# Patient Record
Sex: Female | Born: 1975 | Race: White | Hispanic: No | Marital: Married | Smoking: Never smoker
Health system: Southern US, Community
[De-identification: ages and names within clinical notes are randomized; demographics above are authoritative.]

---

## 1998-07-04 ENCOUNTER — Other Ambulatory Visit: Admission: RE | Admit: 1998-07-04 | Discharge: 1998-07-04 | Payer: Self-pay | Admitting: Gynecology

## 1999-09-18 ENCOUNTER — Other Ambulatory Visit: Admission: RE | Admit: 1999-09-18 | Discharge: 1999-09-18 | Payer: Self-pay | Admitting: Gynecology

## 2000-09-29 ENCOUNTER — Other Ambulatory Visit: Admission: RE | Admit: 2000-09-29 | Discharge: 2000-09-29 | Payer: Self-pay | Admitting: Gynecology

## 2001-11-03 ENCOUNTER — Other Ambulatory Visit: Admission: RE | Admit: 2001-11-03 | Discharge: 2001-11-03 | Payer: Self-pay | Admitting: Gynecology

## 2010-05-07 ENCOUNTER — Ambulatory Visit: Payer: Self-pay | Admitting: Obstetrics and Gynecology

## 2010-05-21 ENCOUNTER — Ambulatory Visit: Payer: Self-pay | Admitting: Obstetrics and Gynecology

## 2010-06-04 ENCOUNTER — Ambulatory Visit: Payer: Self-pay | Admitting: Advanced Practice Midwife

## 2010-06-20 ENCOUNTER — Ambulatory Visit: Payer: Self-pay | Admitting: Obstetrics & Gynecology

## 2010-06-27 ENCOUNTER — Ambulatory Visit
Admission: RE | Admit: 2010-06-27 | Discharge: 2010-06-27 | Payer: Self-pay | Source: Home / Self Care | Attending: Obstetrics & Gynecology | Admitting: Obstetrics & Gynecology

## 2010-07-04 ENCOUNTER — Ambulatory Visit
Admission: RE | Admit: 2010-07-04 | Discharge: 2010-07-04 | Payer: Self-pay | Source: Home / Self Care | Attending: Obstetrics & Gynecology | Admitting: Obstetrics & Gynecology

## 2010-07-05 ENCOUNTER — Encounter: Payer: Self-pay | Admitting: Obstetrics & Gynecology

## 2010-07-05 LAB — CONVERTED CEMR LAB: GC Probe Amp, Genital: NEGATIVE

## 2010-07-10 ENCOUNTER — Ambulatory Visit
Admission: RE | Admit: 2010-07-10 | Discharge: 2010-07-10 | Payer: Self-pay | Source: Home / Self Care | Attending: Obstetrics & Gynecology | Admitting: Obstetrics & Gynecology

## 2010-07-20 ENCOUNTER — Ambulatory Visit
Admission: RE | Admit: 2010-07-20 | Discharge: 2010-07-20 | Payer: Self-pay | Source: Home / Self Care | Attending: Family | Admitting: Family

## 2010-07-27 DIAGNOSIS — Z3480 Encounter for supervision of other normal pregnancy, unspecified trimester: Secondary | ICD-10-CM

## 2010-07-30 ENCOUNTER — Inpatient Hospital Stay (HOSPITAL_COMMUNITY): Admission: AD | Admit: 2010-07-30 | Payer: Self-pay | Source: Home / Self Care | Admitting: Obstetrics & Gynecology

## 2010-08-03 DIAGNOSIS — Z3480 Encounter for supervision of other normal pregnancy, unspecified trimester: Secondary | ICD-10-CM

## 2010-08-03 DIAGNOSIS — O480 Post-term pregnancy: Secondary | ICD-10-CM

## 2010-08-08 ENCOUNTER — Other Ambulatory Visit: Payer: 59 | Admitting: Obstetrics & Gynecology

## 2010-08-08 DIAGNOSIS — O480 Post-term pregnancy: Secondary | ICD-10-CM

## 2010-08-10 ENCOUNTER — Inpatient Hospital Stay (HOSPITAL_COMMUNITY)
Admission: AD | Admit: 2010-08-10 | Discharge: 2010-08-13 | DRG: 775 | Disposition: A | Discharge status: Home / Self Care | Payer: 59 | Source: Ambulatory Visit | Attending: Obstetrics & Gynecology | Admitting: Obstetrics & Gynecology

## 2010-08-10 DIAGNOSIS — O480 Post-term pregnancy: Principal | ICD-10-CM | POA: Diagnosis present

## 2010-08-10 LAB — URIC ACID: Uric Acid, Serum: 2.8 mg/dL (ref 2.4–7.0)

## 2010-08-10 LAB — COMPREHENSIVE METABOLIC PANEL
ALT: 11 U/L (ref 0–35)
AST: 24 U/L (ref 0–37)
Albumin: 3 g/dL — ABNORMAL LOW (ref 3.5–5.2)
Alkaline Phosphatase: 125 U/L — ABNORMAL HIGH (ref 39–117)
BUN: 5 mg/dL — ABNORMAL LOW (ref 6–23)
CO2: 25 mEq/L (ref 19–32)
Calcium: 9.3 mg/dL (ref 8.4–10.5)
Chloride: 103 mEq/L (ref 96–112)
Creatinine, Ser: 0.54 mg/dL (ref 0.4–1.2)
GFR calc Af Amer: >60 mL/min (ref >60)
GFR calc non Af Amer: >60 mL/min (ref >60)
Glucose, Bld: 129 mg/dL — ABNORMAL HIGH (ref 70–99)
Potassium: 4 mEq/L (ref 3.5–5.1)
Sodium: 135 mEq/L (ref 135–145)
Total Bilirubin: 0.4 mg/dL (ref 0.3–1.2)
Total Protein: 6.2 g/dL (ref 6.0–8.3)

## 2010-08-10 LAB — CBC
HCT: 39.4 % (ref 36.0–46.0)
Hemoglobin: 13.4 g/dL (ref 12.0–15.0)
MCH: 31.2 pg (ref 26.0–34.0)
MCHC: 34 g/dL (ref 30.0–36.0)
MCV: 91.6 fL (ref 78.0–100.0)
Platelets: 262 10*3/uL (ref 150–400)
RBC: 4.3 MIL/uL (ref 3.87–5.11)
RDW: 14 % (ref 11.5–15.5)
WBC: 12.6 10*3/uL — ABNORMAL HIGH (ref 4.0–10.5)

## 2010-08-10 LAB — LACTATE DEHYDROGENASE: LDH: 144 U/L (ref 94–250)

## 2010-08-11 DIAGNOSIS — O480 Post-term pregnancy: Secondary | ICD-10-CM

## 2010-08-11 LAB — RPR: RPR Ser Ql: NONREACTIVE

## 2010-08-12 LAB — ABO/RH: ABO/RH(D): O POS

## 2010-09-24 ENCOUNTER — Ambulatory Visit: Payer: 59

## 2010-09-24 DIAGNOSIS — Z3480 Encounter for supervision of other normal pregnancy, unspecified trimester: Secondary | ICD-10-CM

## 2010-09-25 NOTE — Assessment & Plan Note (Signed)
NAME:  CAILEN, TEXEIRA NO.:  [id number]  MEDICAL RECORD NO.:  [id number]           PATIENT TYPE:  LOCATION:  [facility] [place]           FACILITY:  PHYSICIAN:  Kayliana Codd Christy Gentles, CNM       DATE OF BIRTH:  31-Dec-1975  DATE OF SERVICE:  09/24/2010                                 CLINIC NOTE  HISTORY:  This is a 35 year old G1 P1-0-0-1 who is now 6 weeks post NSVD female, discussed her hospital and labor experience.  She is pleased with all of that.  The baby weighed 8 at birth, now weighs about 10 and is thriving.  She is breast-feeding without supplementation.  She did not need suturing of the perineum.  She says she is sleeping well.  The baby sleeps 7 or 8 hours a night.  She has no plans to go back to work. She is requesting a refill on prenatal vitamins.  She states that she has had paps yearly and they have all been negative for more than the last 3 years.  Her last Pap was negative in May 2011.  She plans to use condoms and continue breast-feeding without supplementation.  Her husband is considering vasectomy.  She has plenty of help at home.  She denies significant depression symptoms.  She states she has minimal lochia, just a little dark pink brown spotting.  She denies any irritative vaginal discharge or specific concerns.  OBJECTIVE:  VITAL SIGNS:  BP 132/84, weight 163, height 5 feet 7 inches. HEAD:  Normocephalic.  Good dentition. NECK:  No thyromegaly. LUNGS:  CTA bilateral. HEART:  RRR without murmur. BREASTS:  Lactational, no inflammation. ABDOMEN:  Soft, flat, nontender, minimal diastasis rectus.  Uterus very well involuted and nontender. PELVIC:  Deferred. EXTREMITIES:  No edema.  The [place] postnatal depression scale is completed and is reassuring.  ASSESSMENT:  Normal 6 weeks postpartum G1 P1-0-0-1.  PLAN:  We discussed recommendations regarding Pap smears and she elects to follow the [employer] recommendations and declines  Pap at this visit.  We discussed the need for yearly well-woman exam and she is encouraged to continue good diet and drink plenty of fluids.  She is given a refill for prenatal vitamins and we will see her back in 1 year.          ______________________________ Caren Griffins, CNM    DP/MEDQ  D:  09/24/2010  T:  09/25/2010  Job:  [phone]

## 2011-07-19 ENCOUNTER — Emergency Department (INDEPENDENT_AMBULATORY_CARE_PROVIDER_SITE_OTHER): Payer: 59

## 2011-07-19 ENCOUNTER — Emergency Department (HOSPITAL_BASED_OUTPATIENT_CLINIC_OR_DEPARTMENT_OTHER)
Admission: EM | Admit: 2011-07-19 | Discharge: 2011-07-19 | Disposition: A | Discharge status: Home / Self Care | Payer: 59 | Attending: Emergency Medicine | Admitting: Emergency Medicine

## 2011-07-19 ENCOUNTER — Encounter (HOSPITAL_BASED_OUTPATIENT_CLINIC_OR_DEPARTMENT_OTHER): Payer: Self-pay

## 2011-07-19 DIAGNOSIS — S52123A Displaced fracture of head of unspecified radius, initial encounter for closed fracture: Secondary | ICD-10-CM

## 2011-07-19 DIAGNOSIS — W19XXXA Unspecified fall, initial encounter: Secondary | ICD-10-CM

## 2011-07-19 DIAGNOSIS — M25529 Pain in unspecified elbow: Secondary | ICD-10-CM | POA: Insufficient documentation

## 2011-07-19 DIAGNOSIS — M25429 Effusion, unspecified elbow: Secondary | ICD-10-CM

## 2011-07-19 DIAGNOSIS — S52122A Displaced fracture of head of left radius, initial encounter for closed fracture: Secondary | ICD-10-CM

## 2011-07-19 MED ORDER — HYDROCODONE-ACETAMINOPHEN 5-325 MG PO TABS: 1.0000 | ORAL_TABLET | Freq: Four times a day (QID) | ORAL | Status: AC | PRN

## 2011-07-19 NOTE — ED Notes (Signed)
Fell-pain to left arm/elbow

## 2011-07-19 NOTE — ED Provider Notes (Signed)
History     CSN: [id number]  Arrival date & time 07/19/11  1541   First MD Initiated Contact with Patient 07/19/11 1547     4:09 PM HPI Patient reports she slipped outside and landed on her left arm. Reports only having pain in her left elbow. Painful to bend but is able to. Denies numbness, tingling, weakness or swelling. Denies head injury or neck injury.  Patient is a 36 y.o. female presenting with arm injury. The history is provided by the patient.  Arm Injury  The incident occurred just prior to arrival. The injury mechanism was a fall. The wounds were not self-inflicted. There is an injury to the left elbow. The pain is moderate. Pertinent negatives include no neck pain.    History reviewed. No pertinent past medical history.  History reviewed. No pertinent past surgical history.  No family history on file.  History  Substance Use Topics  . Smoking status: Never Smoker   . Smokeless tobacco: Not on file  . Alcohol Use: No    OB History    Grav Para Term Preterm Abortions TAB SAB Ect Mult Living                  Review of Systems  HENT: Negative for neck pain.   Musculoskeletal: Negative for back pain.       Elbow pain  All other systems reviewed and are negative.    Allergies  Review of patient's allergies indicates no known allergies.  Home Medications   Current Outpatient Rx  Name Route Sig Dispense Refill  . PRENATAL MULTIVITAMIN CH Oral Take 1 tablet by mouth daily.      BP 107/84  Pulse 81  Temp(Src) 97.8 F (36.6 C) (Oral)  Resp 16  Ht 5\' 7"  (1.702 m)  Wt 150 lb (68.04 kg)  BMI 23.49 kg/m2  SpO2 100%  LMP 07/13/2011  Physical Exam  Vitals reviewed. Constitutional: She is oriented to person, place, and time. Vital signs are normal. She appears well-developed and well-nourished. No distress.  HENT:  Head: Normocephalic and atraumatic.  Eyes: Pupils are equal, round, and reactive to light.  Neck: Neck supple.  Pulmonary/Chest: Effort  normal.  Musculoskeletal:       Left elbow: She exhibits decreased range of motion and swelling. She exhibits no deformity and no laceration. tenderness found. Radial head and lateral epicondyle tenderness noted. No olecranon process tenderness noted.       Tenderness with patient lateral left elbow. Decreased range of motion due to pain. Is able to supinate and pronate but with pain. Full range of motion wrist and fingers. Normal cap refill, normal sensation, normal pulses.  Neurological: She is alert and oriented to person, place, and time.  Skin: Skin is warm and dry. No rash noted. No erythema. No pallor.  Psychiatric: She has a normal mood and affect. Her behavior is normal.    ED Course  Procedures Dg Elbow Complete Left  07/19/2011  *RADIOLOGY REPORT*  Clinical Data: Elbow pain after fall.  LEFT ELBOW - COMPLETE 3+ VIEW  Comparison: None.  Findings: Four views study shows an acute fracture of the radial head near the junction of the neck.  No definite fracture extension to the articular surface of the radial head can be identified, but this would be better evaluated by CT.  Fat pad elevation is consistent with joint effusion.  IMPRESSION: Acute radial head fracture with joint effusion.  Original Report Authenticated By: ERIC A. MANSELL, M.D.  MDM    We'll place patient in a posterior splint and sling. Advised followup with orthopedic physician next week. Will advise ice and elevation above level of heart to decrease swelling. She agrees to plan and is ready for discharge     Thomasene Lot, Cordelia Poche 07/19/11 1637

## 2011-07-19 NOTE — ED Provider Notes (Signed)
Medical screening examination/treatment/procedure(s) were performed by non-physician practitioner and as supervising physician I was immediately available for consultation/collaboration.   Dayton Bailiff, MD 07/19/11 [phone]

## 2011-12-22 ENCOUNTER — Other Ambulatory Visit: Payer: Self-pay | Admitting: Obstetrics and Gynecology

## 2012-03-04 ENCOUNTER — Encounter: Payer: Self-pay | Admitting: Obstetrics & Gynecology

## 2012-03-04 ENCOUNTER — Ambulatory Visit (INDEPENDENT_AMBULATORY_CARE_PROVIDER_SITE_OTHER): Payer: 59 | Admitting: Obstetrics & Gynecology

## 2012-03-04 VITALS — BP 121/74 | Temp 98.2°F | Wt 159.0 lb

## 2012-03-04 DIAGNOSIS — G43909 Migraine, unspecified, not intractable, without status migrainosus: Secondary | ICD-10-CM

## 2012-03-04 DIAGNOSIS — Z3480 Encounter for supervision of other normal pregnancy, unspecified trimester: Secondary | ICD-10-CM

## 2012-03-04 DIAGNOSIS — Z113 Encounter for screening for infections with a predominantly sexual mode of transmission: Secondary | ICD-10-CM

## 2012-03-04 DIAGNOSIS — O2340 Unspecified infection of urinary tract in pregnancy, unspecified trimester: Secondary | ICD-10-CM

## 2012-03-04 DIAGNOSIS — Z124 Encounter for screening for malignant neoplasm of cervix: Secondary | ICD-10-CM

## 2012-03-04 DIAGNOSIS — Z1151 Encounter for screening for human papillomavirus (HPV): Secondary | ICD-10-CM

## 2012-03-04 MED ORDER — VITAMIN B-6 25 MG PO TABS: 25.0000 mg | ORAL_TABLET | Freq: Every day | ORAL | Status: DC

## 2012-03-04 NOTE — Progress Notes (Signed)
   Subjective:    Stacie Carson is a G2P1001 [redacted]w[redacted]d being seen today for her first obstetrical visit.  Her obstetrical history is significant for none. Patient does intend to breast feed. Pregnancy history fully reviewed.  Patient reports fatigue and nausea.  Filed Vitals:   03/04/12 0937  BP: 121/74  Temp: 98.2 F (36.8 C)  Weight: 159 lb (72.122 kg)    HISTORY: OB History    Grav Para Term Preterm Abortions TAB SAB Ect Mult Living   2 1 1       1      # Outc Date GA Lbr Len/2nd Wgt Sex Del Anes PTL Lv   1 TRM 2/12 [redacted]w[redacted]d  8lb3oz(3.714kg) F SVD EPI No Yes   2 CUR              History reviewed. No pertinent past medical history. History reviewed. No pertinent past surgical history. Family History  Problem Relation Age of Onset  . Cancer Maternal Grandmother     breast  . 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  System: Breast:  normal appearance, no masses or tenderness   Skin: normal coloration and turgor, no rashes    Neurologic: oriented, normal   Extremities: normal strength, tone, and muscle mass   HEENT oropharynx clear, no lesions, neck supple with midline trachea and thyroid without masses   Mouth/Teeth mucous membranes moist, pharynx normal without lesions   Neck supple and no masses   Cardiovascular: regular rate and rhythm   Respiratory:  appears well, vitals normal, no respiratory distress, acyanotic, normal RR   Abdomen: soft, non-tender; bowel sounds normal; no masses,  no organomegaly   Urinary: urethral meatus normal      Assessment:    Pregnancy: G2P1001 Patient Active Problem List  Diagnosis  . Migraines        Plan:     Initial labs  drawn. Prenatal vitamins. Problem list reviewed and updated. Genetic Screening discussed:  Refused all genetic testing Ultrasound discussed; fetal survey: bedside US shows 8 weeks 4 days.  Follow up in 4 weeks. Vitamin B6 25 mg daily for nausea   LEGGETT,KELLY H. 03/04/2012

## 2012-03-04 NOTE — Addendum Note (Signed)
Addended by: Granville Lewis on: 03/04/2012 11:44 AM   Modules accepted: Orders

## 2012-03-04 NOTE — Progress Notes (Signed)
p-82  Pt is here for NOB with her husband Trey Paula.  They have an 77month girl whom we delivered.

## 2012-03-05 LAB — OBSTETRIC PANEL
Antibody Screen: NEGATIVE
Basophils Absolute: 0 10*3/uL (ref 0.0–0.1)
Basophils Relative: 0 % (ref 0–1)
Eosinophils Absolute: 0 10*3/uL (ref 0.0–0.7)
HCT: 38.8 % (ref 36.0–46.0)
MCHC: 34 g/dL (ref 30.0–36.0)
Monocytes Absolute: 0.5 10*3/uL (ref 0.1–1.0)
Neutro Abs: 4.7 10*3/uL (ref 1.7–7.7)
RDW: 13.8 % (ref 11.5–15.5)

## 2012-03-05 LAB — HIV ANTIBODY (ROUTINE TESTING W REFLEX): HIV: NONREACTIVE

## 2012-03-08 LAB — URINE CULTURE: Colony Count: 25000

## 2012-03-09 DIAGNOSIS — O2340 Unspecified infection of urinary tract in pregnancy, unspecified trimester: Secondary | ICD-10-CM | POA: Insufficient documentation

## 2012-03-09 MED ORDER — AMOXICILLIN 500 MG PO CAPS: 500.0000 mg | ORAL_CAPSULE | Freq: Three times a day (TID) | ORAL | Status: DC

## 2012-03-09 NOTE — Addendum Note (Signed)
Addended by: Lesly Dukes on: 03/09/2012 09:22 PM   Modules accepted: Orders

## 2012-04-03 ENCOUNTER — Encounter: Payer: Self-pay | Admitting: Family

## 2012-04-03 ENCOUNTER — Ambulatory Visit (INDEPENDENT_AMBULATORY_CARE_PROVIDER_SITE_OTHER): Payer: 59 | Admitting: Family

## 2012-04-03 VITALS — BP 138/67 | Wt 160.0 lb

## 2012-04-03 DIAGNOSIS — Z3480 Encounter for supervision of other normal pregnancy, unspecified trimester: Secondary | ICD-10-CM

## 2012-04-03 DIAGNOSIS — Z3490 Encounter for supervision of normal pregnancy, unspecified, unspecified trimester: Secondary | ICD-10-CM | POA: Insufficient documentation

## 2012-04-03 NOTE — Progress Notes (Signed)
Reports nausea gone!!!  Desires quad screen, will obtain at next visit; schedule anatomy ultrasound in 5 wks.

## 2012-04-03 NOTE — Progress Notes (Signed)
p-89 

## 2012-05-01 ENCOUNTER — Ambulatory Visit: Payer: 59 | Admitting: Family

## 2012-05-01 VITALS — BP 118/63 | Wt 163.0 lb

## 2012-05-01 DIAGNOSIS — Z3490 Encounter for supervision of normal pregnancy, unspecified, unspecified trimester: Secondary | ICD-10-CM

## 2012-05-01 NOTE — Progress Notes (Signed)
No questions or concerns; quad today; ultrasound scheduled for 05/11/12.

## 2012-05-01 NOTE — Progress Notes (Signed)
p-84 

## 2012-05-11 ENCOUNTER — Ambulatory Visit (HOSPITAL_COMMUNITY)
Admission: RE | Admit: 2012-05-11 | Discharge: 2012-05-11 | Disposition: A | Discharge status: Home / Self Care | Payer: 59 | Source: Ambulatory Visit | Attending: Family | Admitting: Family

## 2012-05-11 DIAGNOSIS — Z3490 Encounter for supervision of normal pregnancy, unspecified, unspecified trimester: Secondary | ICD-10-CM

## 2012-05-11 DIAGNOSIS — O358XX0 Maternal care for other (suspected) fetal abnormality and damage, not applicable or unspecified: Secondary | ICD-10-CM | POA: Insufficient documentation

## 2012-05-11 DIAGNOSIS — Z363 Encounter for antenatal screening for malformations: Secondary | ICD-10-CM | POA: Insufficient documentation

## 2012-05-11 DIAGNOSIS — Z1389 Encounter for screening for other disorder: Secondary | ICD-10-CM | POA: Insufficient documentation

## 2012-05-22 ENCOUNTER — Encounter: Payer: Self-pay | Admitting: Family

## 2012-06-05 ENCOUNTER — Ambulatory Visit (INDEPENDENT_AMBULATORY_CARE_PROVIDER_SITE_OTHER): Payer: 59 | Admitting: Advanced Practice Midwife

## 2012-06-05 VITALS — BP 120/69 | Temp 98.5°F | Wt 170.0 lb

## 2012-06-05 DIAGNOSIS — Z3480 Encounter for supervision of other normal pregnancy, unspecified trimester: Secondary | ICD-10-CM

## 2012-06-05 DIAGNOSIS — Z3490 Encounter for supervision of normal pregnancy, unspecified, unspecified trimester: Secondary | ICD-10-CM

## 2012-06-05 NOTE — Progress Notes (Signed)
p-88 

## 2012-06-05 NOTE — Progress Notes (Signed)
Doing well.  Good fetal movement, denies vaginal bleeding, LOF, cramping/contractions.  No concerns today.  Discussed practice, number of midwives/attendings, etc.  Discussed upcoming glucose test and labs which will be done at next visit.

## 2012-06-24 NOTE — L&D Delivery Note (Signed)
Attestation of Attending Supervision of Advanced Practitioner: Evaluation and management procedures were performed by the PA/NP/CNM/OB Fellow under my supervision/collaboration. Chart reviewed and agree with management and plan.  Marlis Oldaker V 10/18/2012 8:41 AM

## 2012-06-24 NOTE — L&D Delivery Note (Signed)
Delivery Note At 5:02 AM a viable and healthy female was delivered via Vaginal, Spontaneous Delivery (Presentation: ; Occiput Anterior).  APGAR: 8, 9; weight .   Placenta status: Intact, Spontaneous.  Cord: 3 vessels with the following complications: Tight nuchal cord x 1   Anterior shoulder delivered well after head delivered, but there was difficulty with posterior (right) shoulder. Attempted delivery, employed McRoberts, and then posterior shoulder released.  There was a tight nuchal cord which could not be reduced, so delivered through. There was good flexion of both arms.  Anesthesia: Epidural  Episiotomy: None Lacerations: None Suture Repair: n/a Est. Blood Loss (mL):   Mom to postpartum.  Baby to nursery-stable but with mother for now.Wynelle Bourgeois 10/15/2012, 5:22 AM

## 2012-07-03 ENCOUNTER — Ambulatory Visit (INDEPENDENT_AMBULATORY_CARE_PROVIDER_SITE_OTHER): Payer: 59 | Admitting: Family

## 2012-07-03 VITALS — BP 115/67 | Wt 174.0 lb

## 2012-07-03 DIAGNOSIS — Z3480 Encounter for supervision of other normal pregnancy, unspecified trimester: Secondary | ICD-10-CM

## 2012-07-03 DIAGNOSIS — Z3490 Encounter for supervision of normal pregnancy, unspecified, unspecified trimester: Secondary | ICD-10-CM

## 2012-07-03 NOTE — Progress Notes (Signed)
No questions or concerns; 1 hr test today with 28 wk labs.  Pt reports declined quad screen and did not get at previous visit.

## 2012-07-03 NOTE — Progress Notes (Signed)
p-99 

## 2012-07-04 ENCOUNTER — Encounter: Payer: Self-pay | Admitting: Family

## 2012-07-04 LAB — CBC
MCV: 88.3 fL (ref 78.0–100.0)
Platelets: 264 10*3/uL (ref 150–400)
RBC: 4.09 MIL/uL (ref 3.87–5.11)
WBC: 11.8 10*3/uL — ABNORMAL HIGH (ref 4.0–10.5)

## 2012-07-04 LAB — RPR

## 2012-07-04 LAB — GLUCOSE TOLERANCE, 1 HOUR (50G) W/O FASTING: Glucose, 1 Hour GTT: 98 mg/dL (ref 70–140)

## 2012-07-04 LAB — HIV ANTIBODY (ROUTINE TESTING W REFLEX): HIV: NONREACTIVE

## 2012-07-17 ENCOUNTER — Encounter: Payer: 59 | Admitting: Family

## 2012-07-24 ENCOUNTER — Ambulatory Visit (INDEPENDENT_AMBULATORY_CARE_PROVIDER_SITE_OTHER): Payer: 59 | Admitting: Advanced Practice Midwife

## 2012-07-24 VITALS — BP 127/73 | Wt 176.0 lb

## 2012-07-24 DIAGNOSIS — O2340 Unspecified infection of urinary tract in pregnancy, unspecified trimester: Secondary | ICD-10-CM

## 2012-07-24 DIAGNOSIS — O2390 Unspecified genitourinary tract infection in pregnancy, unspecified trimester: Secondary | ICD-10-CM

## 2012-07-24 DIAGNOSIS — Z3480 Encounter for supervision of other normal pregnancy, unspecified trimester: Secondary | ICD-10-CM

## 2012-07-24 DIAGNOSIS — N390 Urinary tract infection, site not specified: Secondary | ICD-10-CM

## 2012-07-24 DIAGNOSIS — Z3490 Encounter for supervision of normal pregnancy, unspecified, unspecified trimester: Secondary | ICD-10-CM

## 2012-07-24 NOTE — Patient Instructions (Signed)
Preventing Preterm Labor Preterm labor is when a pregnant woman has contractions that cause the cervix to open, shorten, and thin before 37 weeks of pregnancy. You will have regular contractions (tightening) 2 to 3 minutes apart. This usually causes discomfort or pain. HOME CARE  Eat a healthy diet.  Take your vitamins as told by your doctor.  Drink enough fluids to keep your pee (urine) clear or pale yellow every day.  Get rest and sleep.  Do not have sex if you are at high risk for preterm labor.  Follow your doctor's advice about activity, medicines, and tests.  Avoid stress.  Avoid hard labor or exercise that lasts for a long time.  Do not smoke. GET HELP RIGHT AWAY IF:   You are having contractions.  You have belly (abdominal) pain.  You have bleeding from your vagina.  You have pain when you pee (urinate).  You have abnormal discharge from your vagina.  You have a temperature by mouth above 102 F (38.9 C). MAKE SURE YOU:  Understand these instructions.  Will watch your condition.  Will get help if you are not doing well or get worse. Document Released: 09/06/2008 Document Revised: 09/02/2011 Document Reviewed: 09/06/2008 Exit[employer] Patient Information 2013 [employer].  

## 2012-07-24 NOTE — Progress Notes (Signed)
p-100 

## 2012-07-24 NOTE — Progress Notes (Signed)
Reviewed 28 week labs. Some increased low abd pressure. Rare UC's. Declines VE.PTL precautions, Urine TOC for previous UTI (no Sx now). Increase fluids and rest.

## 2012-07-25 LAB — CULTURE, OB URINE: Colony Count: 9000

## 2012-08-17 ENCOUNTER — Ambulatory Visit (INDEPENDENT_AMBULATORY_CARE_PROVIDER_SITE_OTHER): Payer: 59 | Admitting: Advanced Practice Midwife

## 2012-08-17 VITALS — BP 121/75 | Wt 177.0 lb

## 2012-08-17 DIAGNOSIS — Z3480 Encounter for supervision of other normal pregnancy, unspecified trimester: Secondary | ICD-10-CM

## 2012-08-17 DIAGNOSIS — Z3493 Encounter for supervision of normal pregnancy, unspecified, third trimester: Secondary | ICD-10-CM

## 2012-08-17 MED ORDER — SULFAMETHOXAZOLE-TRIMETHOPRIM 800-160 MG PO TABS: 1.0000 | ORAL_TABLET | Freq: Two times a day (BID) | ORAL | Status: DC

## 2012-08-17 NOTE — Progress Notes (Signed)
States baby feels "really big", fundal height 30 today at 32.5 weeks. C/O sinus congestion, headaches, postnasal drip x 3 weeks, using saline nasal spray without much relief. Rx Bactrim for sinusitis, recommend OTC cold medicines/tylenol PM for sleep. F/U with PCP if no improvement. Rev'd precautions.

## 2012-08-17 NOTE — Progress Notes (Signed)
P = 87 

## 2012-09-04 ENCOUNTER — Ambulatory Visit (INDEPENDENT_AMBULATORY_CARE_PROVIDER_SITE_OTHER): Payer: 59 | Admitting: Advanced Practice Midwife

## 2012-09-04 VITALS — BP 112/72 | Wt 178.0 lb

## 2012-09-04 DIAGNOSIS — Z3493 Encounter for supervision of normal pregnancy, unspecified, third trimester: Secondary | ICD-10-CM

## 2012-09-04 DIAGNOSIS — O368190 Decreased fetal movements, unspecified trimester, not applicable or unspecified: Secondary | ICD-10-CM

## 2012-09-04 DIAGNOSIS — Z113 Encounter for screening for infections with a predominantly sexual mode of transmission: Secondary | ICD-10-CM

## 2012-09-04 DIAGNOSIS — Z3480 Encounter for supervision of other normal pregnancy, unspecified trimester: Secondary | ICD-10-CM

## 2012-09-04 LAB — OB RESULTS CONSOLE GC/CHLAMYDIA
Chlamydia: NEGATIVE
Gonorrhea: NEGATIVE

## 2012-09-04 NOTE — Patient Instructions (Addendum)
Fetal Movement Counts Patient Name: __________________________________________________ Patient Due Date: ____________________ Kick counts is highly recommended in high risk pregnancies, but it is a good idea for every pregnant woman to do. Start counting fetal movements at 28 weeks of the pregnancy. Fetal movements increase after eating a full meal or eating or drinking something sweet (the blood sugar is higher). It is also important to drink plenty of fluids (well hydrated) before doing the count. Lie on your left side because it helps with the circulation or you can sit in a comfortable chair with your arms over your belly (abdomen) with no distractions around you. DOING THE COUNT  Try to do the count the same time of day each time you do it.  Mark the day and time, then see how long it takes for you to feel 10 movements (kicks, flutters, swishes, rolls). You should have at least 10 movements within 2 hours. You will most likely feel 10 movements in much less than 2 hours. If you do not, wait an hour and count again. After a couple of days you will see a pattern.  What you are looking for is a change in the pattern or not enough counts in 2 hours. Is it taking longer in time to reach 10 movements? SEEK MEDICAL CARE IF:  You feel less than 10 counts in 2 hours. Tried twice.  No movement in one hour.  The pattern is changing or taking longer each day to reach 10 counts in 2 hours.  You feel the baby is not moving as it usually does. Date: ____________ Movements: ____________ Start time: ____________ Finish time: ____________  Date: ____________ Movements: ____________ Start time: ____________ Finish time: ____________ Date: ____________ Movements: ____________ Start time: ____________ Finish time: ____________ Date: ____________ Movements: ____________ Start time: ____________ Finish time: ____________ Date: ____________ Movements: ____________ Start time: ____________ Finish time:  ____________ Date: ____________ Movements: ____________ Start time: ____________ Finish time: ____________ Date: ____________ Movements: ____________ Start time: ____________ Finish time: ____________ Date: ____________ Movements: ____________ Start time: ____________ Finish time: ____________  Date: ____________ Movements: ____________ Start time: ____________ Finish time: ____________ Date: ____________ Movements: ____________ Start time: ____________ Finish time: ____________ Date: ____________ Movements: ____________ Start time: ____________ Finish time: ____________ Date: ____________ Movements: ____________ Start time: ____________ Finish time: ____________ Date: ____________ Movements: ____________ Start time: ____________ Finish time: ____________ Date: ____________ Movements: ____________ Start time: ____________ Finish time: ____________ Date: ____________ Movements: ____________ Start time: ____________ Finish time: ____________  Date: ____________ Movements: ____________ Start time: ____________ Finish time: ____________ Date: ____________ Movements: ____________ Start time: ____________ Finish time: ____________ Date: ____________ Movements: ____________ Start time: ____________ Finish time: ____________ Date: ____________ Movements: ____________ Start time: ____________ Finish time: ____________ Date: ____________ Movements: ____________ Start time: ____________ Finish time: ____________ Date: ____________ Movements: ____________ Start time: ____________ Finish time: ____________ Date: ____________ Movements: ____________ Start time: ____________ Finish time: ____________  Date: ____________ Movements: ____________ Start time: ____________ Finish time: ____________ Date: ____________ Movements: ____________ Start time: ____________ Finish time: ____________ Date: ____________ Movements: ____________ Start time: ____________ Finish time: ____________ Date: ____________ Movements:  ____________ Start time: ____________ Finish time: ____________ Date: ____________ Movements: ____________ Start time: ____________ Finish time: ____________ Date: ____________ Movements: ____________ Start time: ____________ Finish time: ____________ Date: ____________ Movements: ____________ Start time: ____________ Finish time: ____________  Date: ____________ Movements: ____________ Start time: ____________ Finish time: ____________ Date: ____________ Movements: ____________ Start time: ____________ Finish time: ____________ Date: ____________ Movements: ____________ Start time: ____________ Finish time: ____________ Date: ____________ Movements:   ____________ Start time: ____________ Finish time: ____________ Date: ____________ Movements: ____________ Start time: ____________ Finish time: ____________ Date: ____________ Movements: ____________ Start time: ____________ Finish time: ____________ Date: ____________ Movements: ____________ Start time: ____________ Finish time: ____________  Date: ____________ Movements: ____________ Start time: ____________ Finish time: ____________ Date: ____________ Movements: ____________ Start time: ____________ Finish time: ____________ Date: ____________ Movements: ____________ Start time: ____________ Finish time: ____________ Date: ____________ Movements: ____________ Start time: ____________ Finish time: ____________ Date: ____________ Movements: ____________ Start time: ____________ Finish time: ____________ Date: ____________ Movements: ____________ Start time: ____________ Finish time: ____________ Date: ____________ Movements: ____________ Start time: ____________ Finish time: ____________  Date: ____________ Movements: ____________ Start time: ____________ Finish time: ____________ Date: ____________ Movements: ____________ Start time: ____________ Finish time: ____________ Date: ____________ Movements: ____________ Start time: ____________ Finish  time: ____________ Date: ____________ Movements: ____________ Start time: ____________ Finish time: ____________ Date: ____________ Movements: ____________ Start time: ____________ Finish time: ____________ Date: ____________ Movements: ____________ Start time: ____________ Finish time: ____________ Date: ____________ Movements: ____________ Start time: ____________ Finish time: ____________  Date: ____________ Movements: ____________ Start time: ____________ Finish time: ____________ Date: ____________ Movements: ____________ Start time: ____________ Finish time: ____________ Date: ____________ Movements: ____________ Start time: ____________ Finish time: ____________ Date: ____________ Movements: ____________ Start time: ____________ Finish time: ____________ Date: ____________ Movements: ____________ Start time: ____________ Finish time: ____________ Date: ____________ Movements: ____________ Start time: ____________ Finish time: ____________ Document Released: 07/10/2006 Document Revised: 09/02/2011 Document Reviewed: 01/10/2009 Exit[employer] Patient Information 2013 [employer].  Braxton Hicks Contractions Pregnancy is commonly associated with contractions of the uterus throughout the pregnancy. Towards the end of pregnancy (32 to 34 weeks), these contractions (Braxton Hicks) can develop more often and may become more forceful. This is not true labor because these contractions do not result in opening (dilatation) and thinning of the cervix. They are sometimes difficult to tell apart from true labor because these contractions can be forceful and people have different pain tolerances. You should not feel embarrassed if you go to the hospital with false labor. Sometimes, the only way to tell if you are in true labor is for your caregiver to follow the changes in the cervix. How to tell the difference between true and false labor:  False labor.  The contractions of false labor are usually  shorter, irregular and not as hard as those of true labor.  They are often felt in the front of the lower abdomen and in the groin.  They may leave with walking around or changing positions while lying down.  They get weaker and are shorter lasting as time goes on.  These contractions are usually irregular.  They do not usually become progressively stronger, regular and closer together as with true labor.  True labor.  Contractions in true labor last 30 to 70 seconds, become very regular, usually become more intense, and increase in frequency.  They do not go away with walking.  The discomfort is usually felt in the top of the uterus and spreads to the lower abdomen and low back.  True labor can be determined by your caregiver with an exam. This will show that the cervix is dilating and getting thinner. If there are no prenatal problems or other health problems associated with the pregnancy, it is completely safe to be sent home with false labor and await the onset of true labor. HOME CARE INSTRUCTIONS   Keep up with your usual exercises and instructions.  Take medications as directed.  Keep your regular prenatal appointment.    Eat and drink lightly if you think you are going into labor.  If BH contractions are making you uncomfortable:  Change your activity position from lying down or resting to walking/walking to resting.  Sit and rest in a tub of warm water.  Drink 2 to 3 glasses of water. Dehydration may cause B-H contractions.  Do slow and deep breathing several times an hour. SEEK IMMEDIATE MEDICAL CARE IF:   Your contractions continue to become stronger, more regular, and closer together.  You have a gushing, burst or leaking of fluid from the vagina.  An oral temperature above 102 F (38.9 C) develops.  You have passage of blood-tinged mucus.  You develop vaginal bleeding.  You develop continuous belly (abdominal) pain.  You have low back pain that you  never had before.  You feel the baby's head pushing down causing pelvic pressure.  The baby is not moving as much as it used to. Document Released: 06/10/2005 Document Revised: 09/02/2011 Document Reviewed: 12/02/2008 [employer] Patient Information 2013 [employer].  

## 2012-09-04 NOTE — Progress Notes (Signed)
Sinus congestion returned x 3 days after complete resolution of Sx post-ABX. Family has URI. Likely new viral infection. Neti Pot, Mucinex, increase fluids. Decreased FM. NST category  I. Baby active during NST. FKC reviewed. GBS, GC/CT done

## 2012-09-04 NOTE — Progress Notes (Signed)
P-90 

## 2012-09-11 ENCOUNTER — Ambulatory Visit (INDEPENDENT_AMBULATORY_CARE_PROVIDER_SITE_OTHER): Payer: 59 | Admitting: Advanced Practice Midwife

## 2012-09-11 VITALS — BP 128/77 | Wt 181.0 lb

## 2012-09-11 DIAGNOSIS — Z3493 Encounter for supervision of normal pregnancy, unspecified, third trimester: Secondary | ICD-10-CM

## 2012-09-11 DIAGNOSIS — Z3480 Encounter for supervision of other normal pregnancy, unspecified trimester: Secondary | ICD-10-CM

## 2012-09-11 NOTE — Progress Notes (Signed)
p-99 

## 2012-09-11 NOTE — Patient Instructions (Signed)
Pregnancy - Third Trimester The third trimester begins at the 28th week of pregnancy and ends at birth. It is important to follow your doctor's instructions. HOME CARE   Go to your doctor's visits.  Do not smoke.  Do not drink alcohol or use drugs.  Only take medicine as told by your doctor.  Take prenatal vitamins as told. The vitamin should contain 1 milligram of folic acid.  Exercise.  Eat healthy foods. Eat regular, well-balanced meals.  You can have sex (intercourse) if there are no other problems with the pregnancy.  Do not use hot tubs, steam rooms, or saunas.  Wear a seat belt while driving.  Avoid raw meat, uncooked cheese, and litter boxes and soil used by cats.  Rest with your legs raised (elevated).  Make a list of emergency phone numbers. Keep this list with you.  Arrange for help when you come back home after delivering the baby.  Make a trial run to the hospital.  Take prenatal classes.  Prepare the baby's nursery.  Do not travel out of the city. If you absolutely have to, get permission from your doctor first.  Wear flat shoes. Do not wear high heels. GET HELP RIGHT AWAY IF:   You have a temperature by mouth above 102 F (38.9 C), not controlled by medicine.  You have not felt the baby move for more than 1 hour. If you think the baby is not moving as much as normal, eat something with sugar in it or lie down on your left side for an hour. The baby should move at least 4 to 5 times per hour.  Fluid is coming from the vagina.  Blood is coming from the vagina. Light spotting is common, especially after sex (intercourse).  You have belly (abdominal) pain.  You have a bad smelling fluid (discharge) coming from the vagina. The fluid changes from clear to white.  You still feel sick to your stomach (nauseous).  You throw up (vomit) for more than 24 hours.  You have the chills.  You have shortness of breath.  You have a burning feeling when you  pee (urinate).  You lose or gain more than 2 pounds (0.9 kilograms) of weight over a week, or as told by your doctor.  Your face, hands, feet, or legs get puffy (swell).  You have a bad headache that will not go away.  You start to have problems seeing (blurry or double vision).  You fall, are in a car accident, or have any kind of trauma.  There is mental or physical violence at home.  You have any concerns or worries during your pregnancy. MAKE SURE YOU:   Understand these instructions.  Will watch your condition.  Will get help right away if you are not doing well or get worse. Document Released: 09/04/2009 Document Revised: 09/02/2011 Document Reviewed: 09/04/2009 [employer] Patient Information 2013 [employer].  

## 2012-09-11 NOTE — Progress Notes (Signed)
Well, sinus symptoms improving. GC and GBS negative. Rev'd precuations.

## 2012-09-18 ENCOUNTER — Ambulatory Visit (INDEPENDENT_AMBULATORY_CARE_PROVIDER_SITE_OTHER): Payer: 59 | Admitting: Family

## 2012-09-18 VITALS — BP 116/72 | Wt 180.0 lb

## 2012-09-18 DIAGNOSIS — Z3493 Encounter for supervision of normal pregnancy, unspecified, third trimester: Secondary | ICD-10-CM

## 2012-09-18 DIAGNOSIS — Z3480 Encounter for supervision of other normal pregnancy, unspecified trimester: Secondary | ICD-10-CM

## 2012-09-18 NOTE — Progress Notes (Signed)
No questions or concerns; doing well; reviewed GBS results.

## 2012-09-18 NOTE — Progress Notes (Signed)
p=95 

## 2012-09-28 ENCOUNTER — Ambulatory Visit (INDEPENDENT_AMBULATORY_CARE_PROVIDER_SITE_OTHER): Payer: 59 | Admitting: Family

## 2012-09-28 VITALS — BP 116/75 | Wt 183.0 lb

## 2012-09-28 DIAGNOSIS — O368131 Decreased fetal movements, third trimester, fetus 1: Secondary | ICD-10-CM

## 2012-09-28 DIAGNOSIS — Z3480 Encounter for supervision of other normal pregnancy, unspecified trimester: Secondary | ICD-10-CM

## 2012-09-28 DIAGNOSIS — O368190 Decreased fetal movements, unspecified trimester, not applicable or unspecified: Secondary | ICD-10-CM

## 2012-09-28 NOTE — Progress Notes (Signed)
Discussed call schedule with [facility]; last birth had a midwife she did not know; explained how the call schedule works and if possible one of the [facility] midwives may be able to attend birth if covering elsewhere in hospital and switch with other provider; no additional questions; considering membrane sweeping at next visit.  Unable to urinate at this visit.  Reports decreased movement, moves when touching of abdomen, when still notices movement.  Baby moving throughout NST.  NST - reactive.

## 2012-09-28 NOTE — Progress Notes (Signed)
P - 104 - Pt states contractions are becoming more intense

## 2012-10-05 ENCOUNTER — Ambulatory Visit (INDEPENDENT_AMBULATORY_CARE_PROVIDER_SITE_OTHER): Payer: 59 | Admitting: Advanced Practice Midwife

## 2012-10-05 VITALS — BP 131/77 | Wt 184.0 lb

## 2012-10-05 DIAGNOSIS — Z3493 Encounter for supervision of normal pregnancy, unspecified, third trimester: Secondary | ICD-10-CM

## 2012-10-05 DIAGNOSIS — Z3480 Encounter for supervision of other normal pregnancy, unspecified trimester: Secondary | ICD-10-CM

## 2012-10-05 NOTE — Progress Notes (Signed)
Well, ready for labor. Induction scheduled for 41 weeks, 4/23 at 7:30 PM. Rev'd precautions, kick counts, labor. NST at next visit.

## 2012-10-05 NOTE — Patient Instructions (Addendum)
Induction Scheduled for 7:30 PM on 10/14/12. You can eat a light meal prior to arrival!  Pregnancy - Third Trimester The third trimester begins at the 28th week of pregnancy and ends at birth. It is important to follow your doctor's instructions. HOME CARE   Go to your doctor's visits.  Do not smoke.  Do not drink alcohol or use drugs.  Only take medicine as told by your doctor.  Take prenatal vitamins as told. The vitamin should contain 1 milligram of folic acid.  Exercise.  Eat healthy foods. Eat regular, well-balanced meals.  You can have sex (intercourse) if there are no other problems with the pregnancy.  Do not use hot tubs, steam rooms, or saunas.  Wear a seat belt while driving.  Avoid raw meat, uncooked cheese, and litter boxes and soil used by cats.  Rest with your legs raised (elevated).  Make a list of emergency phone numbers. Keep this list with you.  Arrange for help when you come back home after delivering the baby.  Make a trial run to the hospital.  Take prenatal classes.  Prepare the baby's nursery.  Do not travel out of the city. If you absolutely have to, get permission from your doctor first.  Wear flat shoes. Do not wear high heels. GET HELP RIGHT AWAY IF:   You have a temperature by mouth above 102 F (38.9 C), not controlled by medicine.  You have not felt the baby move for more than 1 hour. If you think the baby is not moving as much as normal, eat something with sugar in it or lie down on your left side for an hour. The baby should move at least 4 to 5 times per hour.  Fluid is coming from the vagina.  Blood is coming from the vagina. Light spotting is common, especially after sex (intercourse).  You have belly (abdominal) pain.  You have a bad smelling fluid (discharge) coming from the vagina. The fluid changes from clear to white.  You still feel sick to your stomach (nauseous).  You throw up (vomit) for more than 24 hours.  You  have the chills.  You have shortness of breath.  You have a burning feeling when you pee (urinate).  You lose or gain more than 2 pounds (0.9 kilograms) of weight over a week, or as told by your doctor.  Your face, hands, feet, or legs get puffy (swell).  You have a bad headache that will not go away.  You start to have problems seeing (blurry or double vision).  You fall, are in a car accident, or have any kind of trauma.  There is mental or physical violence at home.  You have any concerns or worries during your pregnancy. MAKE SURE YOU:   Understand these instructions.  Will watch your condition.  Will get help right away if you are not doing well or get worse. Document Released: 09/04/2009 Document Revised: 09/02/2011 Document Reviewed: 09/04/2009 [facility] Patient Information 2013 [place], [place].

## 2012-10-05 NOTE — Progress Notes (Signed)
P-100 

## 2012-10-06 ENCOUNTER — Telehealth (HOSPITAL_COMMUNITY): Payer: Self-pay | Admitting: *Deleted

## 2012-10-06 NOTE — Telephone Encounter (Signed)
Preadmission screen  

## 2012-10-12 ENCOUNTER — Ambulatory Visit (INDEPENDENT_AMBULATORY_CARE_PROVIDER_SITE_OTHER): Payer: 59 | Admitting: Advanced Practice Midwife

## 2012-10-12 ENCOUNTER — Encounter: Payer: Self-pay | Admitting: *Deleted

## 2012-10-12 VITALS — BP 133/77 | Wt 187.0 lb

## 2012-10-12 DIAGNOSIS — Z3493 Encounter for supervision of normal pregnancy, unspecified, third trimester: Secondary | ICD-10-CM

## 2012-10-12 DIAGNOSIS — Z3480 Encounter for supervision of other normal pregnancy, unspecified trimester: Secondary | ICD-10-CM

## 2012-10-12 DIAGNOSIS — O480 Post-term pregnancy: Secondary | ICD-10-CM

## 2012-10-12 NOTE — Progress Notes (Signed)
p- 88  Lost mucus plug last week

## 2012-10-12 NOTE — Progress Notes (Signed)
Well, ready, IOL on 4/23. Reactive NST. Rev'd precautions.

## 2012-10-14 ENCOUNTER — Encounter (HOSPITAL_COMMUNITY): Payer: Self-pay

## 2012-10-14 ENCOUNTER — Inpatient Hospital Stay (HOSPITAL_COMMUNITY)
Admission: RE | Admit: 2012-10-14 | Discharge: 2012-10-16 | DRG: 775 | Disposition: A | Discharge status: Home / Self Care | Payer: 59 | Source: Ambulatory Visit | Attending: Obstetrics and Gynecology | Admitting: Obstetrics and Gynecology

## 2012-10-14 VITALS — BP 122/77 | HR 71 | Temp 97.9°F | Resp 18 | Ht 67.0 in | Wt 187.0 lb

## 2012-10-14 DIAGNOSIS — O480 Post-term pregnancy: Principal | ICD-10-CM | POA: Diagnosis present

## 2012-10-14 DIAGNOSIS — O09529 Supervision of elderly multigravida, unspecified trimester: Secondary | ICD-10-CM | POA: Diagnosis present

## 2012-10-14 DIAGNOSIS — Z3490 Encounter for supervision of normal pregnancy, unspecified, unspecified trimester: Secondary | ICD-10-CM

## 2012-10-14 DIAGNOSIS — Z3493 Encounter for supervision of normal pregnancy, unspecified, third trimester: Secondary | ICD-10-CM

## 2012-10-14 LAB — CBC
HCT: 34.9 % — ABNORMAL LOW (ref 36.0–46.0)
MCV: 89 fL (ref 78.0–100.0)
Platelets: 253 10*3/uL (ref 150–400)
RBC: 3.92 MIL/uL (ref 3.87–5.11)
WBC: 13.3 10*3/uL — ABNORMAL HIGH (ref 4.0–10.5)

## 2012-10-14 LAB — TYPE AND SCREEN: Antibody Screen: NEGATIVE

## 2012-10-14 MED ORDER — OXYTOCIN 40 UNITS IN LACTATED RINGERS INFUSION - SIMPLE MED
1.0000 m[IU]/min | INTRAVENOUS | Status: DC
  Administered 2012-10-14: 4 m[IU]/min via INTRAVENOUS
  Administered 2012-10-14: 2 m[IU]/min via INTRAVENOUS
  Filled 2012-10-14: qty 1000

## 2012-10-14 MED ORDER — FLEET ENEMA 7-19 GM/118ML RE ENEM: 1.0000 | ENEMA | RECTAL | Status: DC | PRN

## 2012-10-14 MED ORDER — LACTATED RINGERS IV SOLN
INTRAVENOUS | Status: DC
  Administered 2012-10-15: 02:00:00 via INTRAVENOUS

## 2012-10-14 MED ORDER — CITRIC ACID-SODIUM CITRATE 334-500 MG/5ML PO SOLN: 30.0000 mL | ORAL | Status: DC | PRN

## 2012-10-14 MED ORDER — NALBUPHINE SYRINGE 5 MG/0.5 ML
10.0000 mg | INJECTION | INTRAMUSCULAR | Status: DC | PRN
  Filled 2012-10-14: qty 1

## 2012-10-14 MED ORDER — OXYTOCIN 40 UNITS IN LACTATED RINGERS INFUSION - SIMPLE MED: 1.0000 m[IU]/min | INTRAVENOUS | Status: DC

## 2012-10-14 MED ORDER — IBUPROFEN 600 MG PO TABS: 600.0000 mg | ORAL_TABLET | Freq: Four times a day (QID) | ORAL | Status: DC | PRN

## 2012-10-14 MED ORDER — LACTATED RINGERS IV SOLN: 500.0000 mL | INTRAVENOUS | Status: DC | PRN

## 2012-10-14 MED ORDER — TERBUTALINE SULFATE 1 MG/ML IJ SOLN: 0.2500 mg | Freq: Once | INTRAMUSCULAR | Status: AC | PRN

## 2012-10-14 MED ORDER — ACETAMINOPHEN 325 MG PO TABS: 650.0000 mg | ORAL_TABLET | ORAL | Status: DC | PRN

## 2012-10-14 MED ORDER — ONDANSETRON HCL 4 MG/2ML IJ SOLN
4.0000 mg | Freq: Four times a day (QID) | INTRAMUSCULAR | Status: DC | PRN
  Administered 2012-10-15: 4 mg via INTRAVENOUS
  Filled 2012-10-14: qty 2

## 2012-10-14 MED ORDER — TERBUTALINE SULFATE 1 MG/ML IJ SOLN: 0.2500 mg | Freq: Once | INTRAMUSCULAR | Status: DC | PRN

## 2012-10-14 MED ORDER — LIDOCAINE HCL (PF) 1 % IJ SOLN
30.0000 mL | INTRAMUSCULAR | Status: DC | PRN
  Filled 2012-10-14 (×2): qty 30

## 2012-10-14 MED ORDER — OXYTOCIN 40 UNITS IN LACTATED RINGERS INFUSION - SIMPLE MED
62.5000 mL/h | INTRAVENOUS | Status: DC
  Administered 2012-10-15: 62.5 mL/h via INTRAVENOUS

## 2012-10-14 MED ORDER — OXYTOCIN BOLUS FROM INFUSION: 500.0000 mL | INTRAVENOUS | Status: DC

## 2012-10-14 MED ORDER — OXYCODONE-ACETAMINOPHEN 5-325 MG PO TABS
1.0000 | ORAL_TABLET | ORAL | Status: DC | PRN
Start: 2012-10-14 — End: 2012-10-15

## 2012-10-14 MED ORDER — LACTATED RINGERS IV SOLN: INTRAVENOUS | Status: DC

## 2012-10-14 NOTE — H&P (Signed)
Attestation of Attending Supervision of Advanced Practitioner: Evaluation and management procedures were performed by the PA/NP/CNM/OB Fellow under my supervision/collaboration. Chart reviewed and agree with management and plan.  Tilda Burrow 10/14/2012 10:43 PM

## 2012-10-14 NOTE — H&P (Signed)
Stacie Carson is a 37 y.o. female at [redacted]w[redacted]d  presenting for Induction of Labor for PostDates . Maternal Medical History:  Reason for admission: Nausea.  Contractions: Frequency: irregular.   Perceived severity is mild.    Fetal activity: Perceived fetal activity is normal.   Last perceived fetal movement was within the past hour.    Prenatal complications: No bleeding or preterm labor.   Prenatal Complications - Diabetes: none.    OB History   Grav Para Term Preterm Abortions TAB SAB Ect Mult Living   2 1 1       1      History reviewed. No pertinent past medical history. History reviewed. No pertinent past surgical history. Family History: family history includes Cancer in her maternal grandmother and Multiple sclerosis in her maternal aunt. Social History:  reports that she has never smoked. She has never used smokeless tobacco. She reports that she does not drink alcohol or use illicit drugs.   Prenatal Transfer Tool  Maternal Diabetes: No Genetic Screening: Declined Maternal Ultrasounds/Referrals: Normal Fetal Ultrasounds or other Referrals:  None Maternal Substance Abuse:  No Significant Maternal Medications:  None Significant Maternal Lab Results:  None Other Comments:  None  Review of Systems  Constitutional: Negative for fever, chills and malaise/fatigue.  Gastrointestinal: Negative for nausea, vomiting, abdominal pain, diarrhea and constipation.  Genitourinary: Negative for dysuria.  Neurological: Negative for headaches.  Psychiatric/Behavioral: Negative for depression. The patient is not nervous/anxious.     Dilation: 2.5 Effacement (%): 60 Station: -2 Exam by:: M.Loisann Roach,CNM Blood pressure 137/79, pulse 90, temperature 98.2 F (36.8 C), temperature source Oral, resp. rate 18, height 5\' 7"  (1.702 m), weight 187 lb (84.823 kg), last menstrual period 01/01/2012, unknown if currently breastfeeding. Maternal Exam:  Uterine Assessment: Contraction strength is  mild.  Contraction frequency is irregular.   Abdomen: Fundal height is 39.   Estimated fetal weight is 8.   Fetal presentation: vertex  Introitus: Normal vulva. Normal vagina.  Vagina is negative for discharge.  Ferning test: not done.  Nitrazine test: not done. Amniotic fluid character: not assessed.  Pelvis: adequate for delivery.   Cervix: Cervix evaluated by digital exam.     Fetal Exam Fetal Monitor Review: Mode: ultrasound.   Baseline rate: 140.  Variability: moderate (6-25 bpm).   Pattern: no decelerations and accelerations present.    Fetal State Assessment: Category I - tracings are normal.     Physical Exam  Constitutional: She is oriented to person, place, and time. She appears well-developed and well-nourished. No distress.  HENT:  Head: Normocephalic.  Neck: Normal range of motion. Neck supple.  Cardiovascular: Normal rate, regular rhythm and normal heart sounds.   Respiratory: Effort normal and breath sounds normal. No respiratory distress. She has no wheezes. She has no rales. She exhibits no tenderness.  GI: Soft. She exhibits no distension and no mass. There is no tenderness. There is no rebound and no guarding.  Genitourinary: Vagina normal and uterus normal. No vaginal discharge found.  Musculoskeletal: Normal range of motion. She exhibits no edema.  Neurological: She is alert and oriented to person, place, and time.  Skin: Skin is warm and dry.  Psychiatric: She has a normal mood and affect.   Dilation: 2.5 Effacement (%): 60 Station: -2 Presentation: Vertex Exam by:: M.Elwood Bazinet,CNM  Prenatal labs: ABO, Rh: O/POS/-- (09/11 1035) Antibody: NEG (09/11 1035) Rubella: 72.6 (09/11 1035) RPR: NON REAC (01/10 1001)  HBsAg: NEGATIVE (09/11 1035)  HIV: NON REACTIVE (01/10 1001)  GBS: Negative (03/14 0000)   Assessment/Plan: A;  SIUP at [redacted]w[redacted]d       Post Dates       P:  Admit to [facility]       Routine Orders      IOL discussed with patient  and spouse. She wanted Cytotec, but I think she is contracting too often for that.so I recommend Pitocin, so that we can control the dose as needed  They agree   [facility] 10/14/2012, 9:13 PM

## 2012-10-15 ENCOUNTER — Inpatient Hospital Stay (HOSPITAL_COMMUNITY): Payer: 59 | Admitting: Anesthesiology

## 2012-10-15 ENCOUNTER — Encounter (HOSPITAL_COMMUNITY): Payer: Self-pay | Admitting: Anesthesiology

## 2012-10-15 ENCOUNTER — Encounter (HOSPITAL_COMMUNITY): Payer: Self-pay

## 2012-10-15 DIAGNOSIS — O09529 Supervision of elderly multigravida, unspecified trimester: Secondary | ICD-10-CM

## 2012-10-15 DIAGNOSIS — O480 Post-term pregnancy: Secondary | ICD-10-CM

## 2012-10-15 LAB — RPR: RPR Ser Ql: NONREACTIVE

## 2012-10-15 MED ORDER — PRENATAL MULTIVITAMIN CH
1.0000 | ORAL_TABLET | Freq: Every day | ORAL | Status: DC
  Administered 2012-10-15: 1 via ORAL
  Filled 2012-10-15 (×2): qty 1

## 2012-10-15 MED ORDER — SENNOSIDES-DOCUSATE SODIUM 8.6-50 MG PO TABS: 2.0000 | ORAL_TABLET | Freq: Every day | ORAL | Status: DC

## 2012-10-15 MED ORDER — FENTANYL 2.5 MCG/ML BUPIVACAINE 1/10 % EPIDURAL INFUSION (WH - ANES)
14.0000 mL/h | INTRAMUSCULAR | Status: DC | PRN
  Filled 2012-10-15: qty 125

## 2012-10-15 MED ORDER — LACTATED RINGERS IV SOLN: 500.0000 mL | Freq: Once | INTRAVENOUS | Status: DC

## 2012-10-15 MED ORDER — EPHEDRINE 5 MG/ML INJ
10.0000 mg | INTRAVENOUS | Status: DC | PRN
  Filled 2012-10-15: qty 4
  Filled 2012-10-15: qty 2

## 2012-10-15 MED ORDER — DIPHENHYDRAMINE HCL 50 MG/ML IJ SOLN: 12.5000 mg | INTRAMUSCULAR | Status: DC | PRN

## 2012-10-15 MED ORDER — EPHEDRINE 5 MG/ML INJ
10.0000 mg | INTRAVENOUS | Status: DC | PRN
  Filled 2012-10-15: qty 2

## 2012-10-15 MED ORDER — ZOLPIDEM TARTRATE 5 MG PO TABS: 5.0000 mg | ORAL_TABLET | Freq: Every evening | ORAL | Status: DC | PRN

## 2012-10-15 MED ORDER — FENTANYL 2.5 MCG/ML BUPIVACAINE 1/10 % EPIDURAL INFUSION (WH - ANES)
INTRAMUSCULAR | Status: DC | PRN
  Administered 2012-10-15: 14 mL/h via EPIDURAL

## 2012-10-15 MED ORDER — BENZOCAINE-MENTHOL 20-0.5 % EX AERO
1.0000 "application " | INHALATION_SPRAY | CUTANEOUS | Status: DC | PRN
  Administered 2012-10-15 – 2012-10-16 (×2): 1 via TOPICAL
  Filled 2012-10-15 (×2): qty 56

## 2012-10-15 MED ORDER — IBUPROFEN 600 MG PO TABS
600.0000 mg | ORAL_TABLET | Freq: Four times a day (QID) | ORAL | Status: DC
  Administered 2012-10-15 – 2012-10-16 (×2): 600 mg via ORAL
  Filled 2012-10-15 (×3): qty 1

## 2012-10-15 MED ORDER — PHENYLEPHRINE 40 MCG/ML (10ML) SYRINGE FOR IV PUSH (FOR BLOOD PRESSURE SUPPORT)
80.0000 ug | PREFILLED_SYRINGE | INTRAVENOUS | Status: DC | PRN
  Filled 2012-10-15: qty 2

## 2012-10-15 MED ORDER — BUPIVACAINE HCL (PF) 0.25 % IJ SOLN
INTRAMUSCULAR | Status: DC | PRN
  Administered 2012-10-15 (×2): 5 mL

## 2012-10-15 MED ORDER — LIDOCAINE HCL (PF) 1 % IJ SOLN
INTRAMUSCULAR | Status: DC | PRN
  Administered 2012-10-15: 5 mL
  Administered 2012-10-15: 3 mL
  Administered 2012-10-15: 5 mL

## 2012-10-15 MED ORDER — SIMETHICONE 80 MG PO CHEW: 80.0000 mg | CHEWABLE_TABLET | ORAL | Status: DC | PRN

## 2012-10-15 MED ORDER — LANOLIN HYDROUS EX OINT: TOPICAL_OINTMENT | CUTANEOUS | Status: DC | PRN

## 2012-10-15 MED ORDER — ONDANSETRON HCL 4 MG PO TABS: 4.0000 mg | ORAL_TABLET | ORAL | Status: DC | PRN

## 2012-10-15 MED ORDER — TETANUS-DIPHTH-ACELL PERTUSSIS 5-2.5-18.5 LF-MCG/0.5 IM SUSP: 0.5000 mL | Freq: Once | INTRAMUSCULAR | Status: DC

## 2012-10-15 MED ORDER — ONDANSETRON HCL 4 MG/2ML IJ SOLN: 4.0000 mg | INTRAMUSCULAR | Status: DC | PRN

## 2012-10-15 MED ORDER — OXYCODONE-ACETAMINOPHEN 5-325 MG PO TABS: 1.0000 | ORAL_TABLET | ORAL | Status: DC | PRN

## 2012-10-15 MED ORDER — WITCH HAZEL-GLYCERIN EX PADS: 1.0000 "application " | MEDICATED_PAD | CUTANEOUS | Status: DC | PRN

## 2012-10-15 MED ORDER — DIBUCAINE 1 % RE OINT: 1.0000 "application " | TOPICAL_OINTMENT | RECTAL | Status: DC | PRN

## 2012-10-15 MED ORDER — PHENYLEPHRINE 40 MCG/ML (10ML) SYRINGE FOR IV PUSH (FOR BLOOD PRESSURE SUPPORT)
80.0000 ug | PREFILLED_SYRINGE | INTRAVENOUS | Status: DC | PRN
  Filled 2012-10-15: qty 2
  Filled 2012-10-15: qty 5

## 2012-10-15 MED ORDER — DIPHENHYDRAMINE HCL 25 MG PO CAPS: 25.0000 mg | ORAL_CAPSULE | Freq: Four times a day (QID) | ORAL | Status: DC | PRN

## 2012-10-15 NOTE — Anesthesia Procedure Notes (Signed)
Epidural Patient location during procedure: OB  Staffing Anesthesiologist: Dominic Mahaney Performed by: anesthesiologist   Preanesthetic Checklist Completed: patient identified, site marked, surgical consent, pre-op evaluation, timeout performed, IV checked, risks and benefits discussed and monitors and equipment checked  Epidural Patient position: sitting Prep: ChloraPrep Patient monitoring: heart rate, continuous pulse ox and blood pressure Approach: midline Injection technique: LOR saline  Needle:  Needle type: Tuohy  Needle gauge: 17 G Needle length: 9 cm and 9 Needle insertion depth: 7 cm Catheter type: closed end flexible Catheter size: 20 Guage Catheter at skin depth: 14 cm Test dose: negative  Assessment Events: blood not aspirated, injection not painful, no injection resistance, negative IV test and no paresthesia  Additional Notes   Patient tolerated the insertion well without complications.   

## 2012-10-15 NOTE — Progress Notes (Signed)
Stacie Carson is a 37 y.o. G2P1001 at [redacted]w[redacted]d by ultrasound admitted for induction of labor due to Post dates.  Subjective: Just got epidural.  UCs became closer together and RN had to turn Pitocin down. Pt feels some pressure.  Objective: BP 125/84  Pulse 87  Temp(Src) 98 F (36.7 C) (Oral)  Resp 20  Ht 5\' 7"  (1.702 m)  Wt 187 lb (84.823 kg)  BMI 29.28 kg/m2  LMP 01/01/2012      FHT:  FHR: 145 bpm, variability: moderate,  accelerations:  Present,  decelerations:  Absent UC:   regular, every 1.5 minutes SVE:   Dilation: 5 Effacement (%): 90 Station: -1 Exam by:: M.Williams,CNM  Labs: Lab Results  Component Value Date   WBC 13.3* 10/14/2012   HGB 11.9* 10/14/2012   HCT 34.9* 10/14/2012   MCV 89.0 10/14/2012   PLT 253 10/14/2012    Assessment / Plan: Induction of labor due to postterm,  progressing well on pitocin  Labor: Progressing on Pitocin, will continue to increase then AROM Preeclampsia:  n/a Fetal Wellbeing:  Category I Pain Control:  Epidural I/D:  n/a Anticipated MOD:  NSVD  WILLIAMS,MARIE 10/15/2012, 2:45 AM

## 2012-10-15 NOTE — Anesthesia Preprocedure Evaluation (Signed)

## 2012-10-15 NOTE — Progress Notes (Signed)
Stacie Carson is a 37 y.o. G2P1001 at [redacted]w[redacted]d by ultrasound admitted for induction of labor due to Post dates.  Subjective: Just starting to feel pain with some contractions.  Objective: BP 108/58  Pulse 79  Temp(Src) 98.2 F (36.8 C) (Oral)  Resp 18  Ht 5\' 7"  (1.702 m)  Wt 187 lb (84.823 kg)  BMI 29.28 kg/m2  LMP 01/01/2012      FHT:  FHR: 145 bpm, variability: moderate,  accelerations:  Present,  decelerations:  Absent UC:   regular, every 2 minutes SVE:   Dilation: 2.5 Effacement (%): 60 Station: -2 Exam by:: M.Almetta Liddicoat,CNM  Labs: Lab Results  Component Value Date   WBC 13.3* 10/14/2012   HGB 11.9* 10/14/2012   HCT 34.9* 10/14/2012   MCV 89.0 10/14/2012   PLT 253 10/14/2012    Assessment / Plan: Induction of labor due to postterm,  progressing well on pitocin  Labor: Progressing on Pitocin, will continue to increase then AROM Preeclampsia:  n/a Fetal Wellbeing:  Category I Pain Control:  Labor support without medications I/D:  n/a Anticipated MOD:  NSVD  Stacie Carson 10/15/2012, 12:11 AM

## 2012-10-15 NOTE — Anesthesia Postprocedure Evaluation (Signed)
  Anesthesia Post-op Note  Patient: Stacie Carson  Procedure(s) Performed: * No procedures listed *  Patient Location: PACU and Mother/Baby  Anesthesia Type:Epidural  Level of Consciousness: awake, alert , oriented and patient cooperative  Airway and Oxygen Therapy: Patient Spontanous Breathing  Post-op Pain: none  Post-op Assessment: Post-op Vital signs reviewed, Patient's Cardiovascular Status Stable and Respiratory Function Stable  Post-op Vital Signs: Reviewed and stable  Complications: No apparent anesthesia complications

## 2012-10-16 MED ORDER — IBUPROFEN 600 MG PO TABS: 600.0000 mg | ORAL_TABLET | Freq: Four times a day (QID) | ORAL | Status: AC

## 2012-10-16 NOTE — Discharge Summary (Signed)
Obstetric Discharge Summary Reason for Admission: induction of labor and for post dates Prenatal Procedures: none Intrapartum Procedures: spontaneous vaginal delivery Postpartum Procedures: none Complications-Operative and Postpartum: none Hemoglobin  Date Value Range Status  10/14/2012 11.9* 12.0 - 15.0 g/dL Final     HCT  Date Value Range Status  10/14/2012 34.9* 36.0 - 46.0 % Final    Physical Exam:  General: alert, cooperative, appears stated age and no distress Lochia: appropriate Uterine Fundus: firm Incision:  DVT Evaluation: No evidence of DVT seen on physical exam. Negative Homan's sign. No cords or calf tenderness. No significant calf/ankle edema.  Discharge Diagnoses: Term Pregnancy-delivered  Discharge Information: Date: 10/16/2012 Activity: pelvic rest Diet: routine Medications: PNV and Ibuprofen Condition: stable Instructions: refer to practice specific booklet Discharge to: home Follow-up Information   Follow up with [facility] In 6 weeks.   Contact information:   [address] [place] [address]       Newborn Data: Live born female  Birth Weight: 9 lb 1.3 oz (4120 g) APGAR: 8, 9  Home with mother.  RIGBY, MICHAEL 10/16/2012, 9:17 AM I have seen and examined this patient and agree the above assessment. CRESENZO-DISHMAN,Malon Branton 10/20/2012 11:16 AM

## 2012-11-02 ENCOUNTER — Other Ambulatory Visit: Payer: Self-pay | Admitting: Obstetrics and Gynecology

## 2012-11-25 ENCOUNTER — Ambulatory Visit: Payer: 59 | Admitting: Obstetrics & Gynecology

## 2012-12-02 ENCOUNTER — Encounter: Payer: Self-pay | Admitting: Obstetrics & Gynecology

## 2012-12-02 ENCOUNTER — Ambulatory Visit (INDEPENDENT_AMBULATORY_CARE_PROVIDER_SITE_OTHER): Payer: 59 | Admitting: Obstetrics & Gynecology

## 2012-12-02 NOTE — Progress Notes (Signed)
  Subjective:    Patient ID: Stacie Carson, female    DOB: 07-Jan-1976, 37 y.o.   MRN: [id number]  HPI  She is here for a 6 weeks pp visit. She had a NSVD with no tears and no epidural. She and the baby/her family are doing well. Her husband's vasectomy is 12-28-12. She has not had sex yet. She reports normal bowel and bladder function. She denies any symptoms of depression. She is breast feeding without difficulty. She breastfed her 18 yo daughter for a year.  Review of Systems Pap normal 9/13    Objective:   Physical Exam  Normal perinuem, vagina, and cervix. Involuted uterus      Assessment & Plan:  PP exam- doing well RTC 1 year for annual/prn sooner

## 2014-04-25 ENCOUNTER — Encounter: Payer: Self-pay | Admitting: Obstetrics & Gynecology

## 2014-06-21 IMAGING — US US OB DETAIL+14 WK
1 series · 12 of 28 positions shown · non-contrast
Comparison: none

[Series 1: us ob detail +14 wk · 12 of 96 slices shown]
[im 4/96]
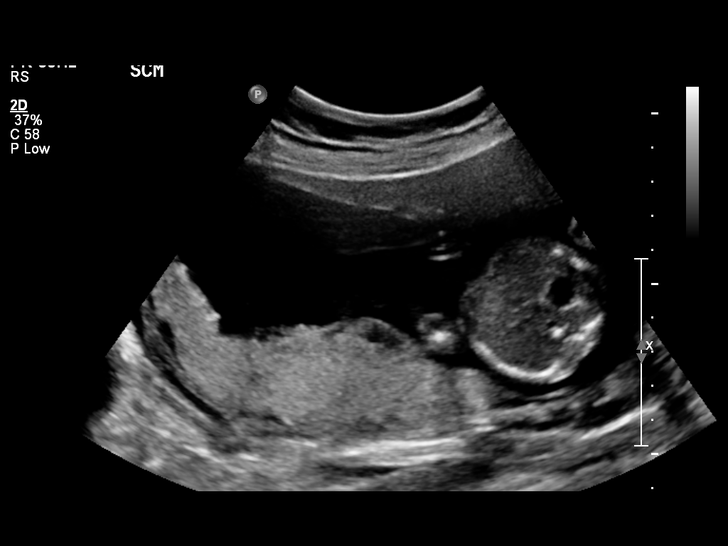
[im 11/96]
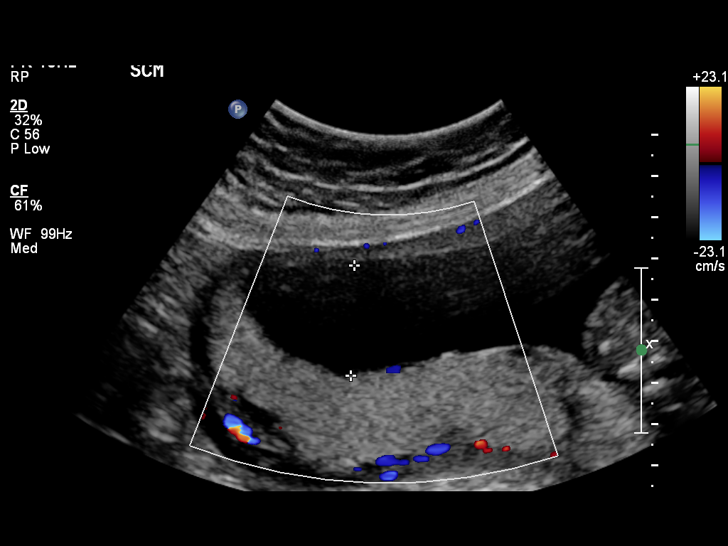
[im 18/96]
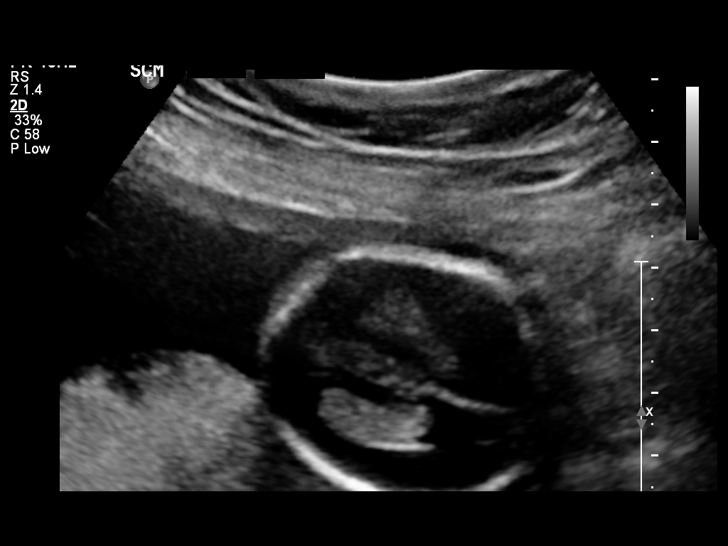
[im 29/96]
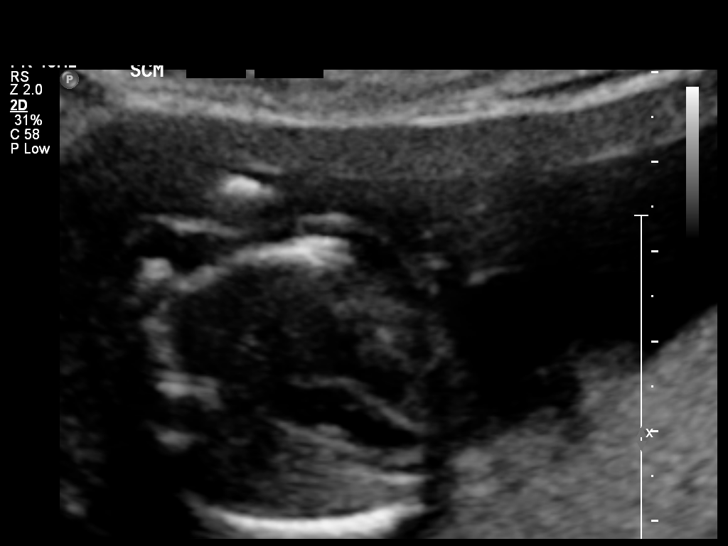
[im 36/96]
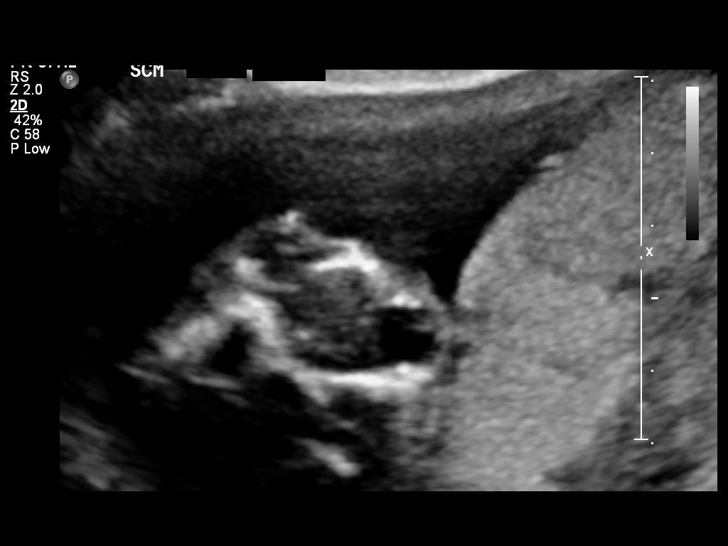
[im 43/96]
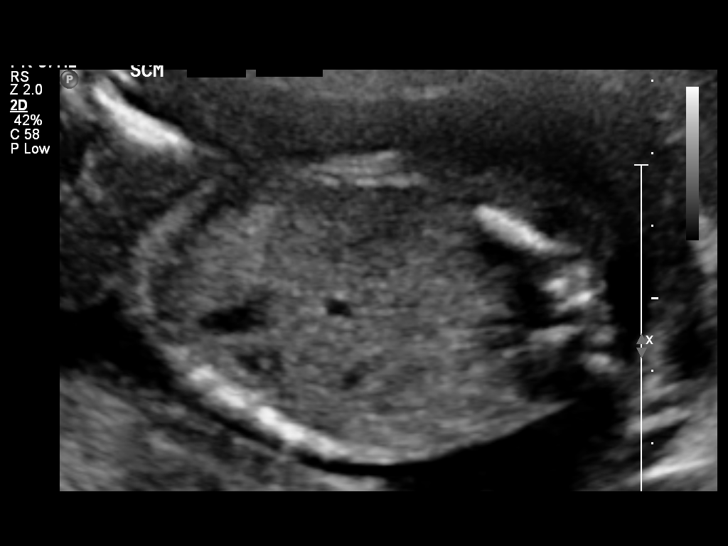
[im 53/96]
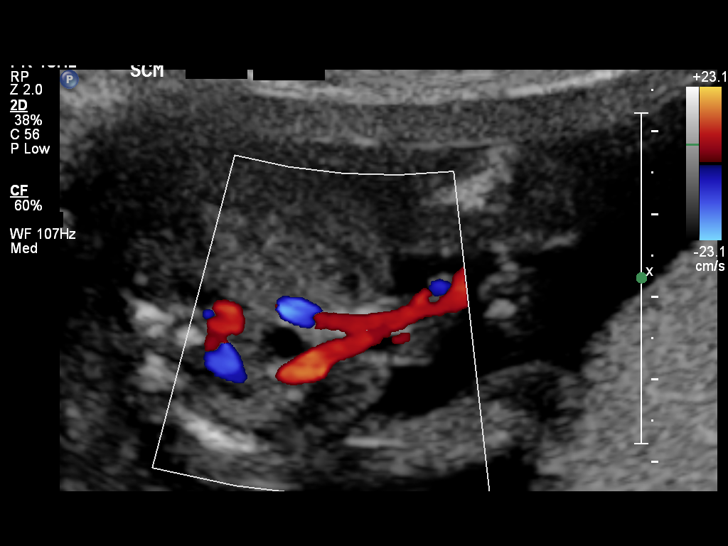
[im 60/96]
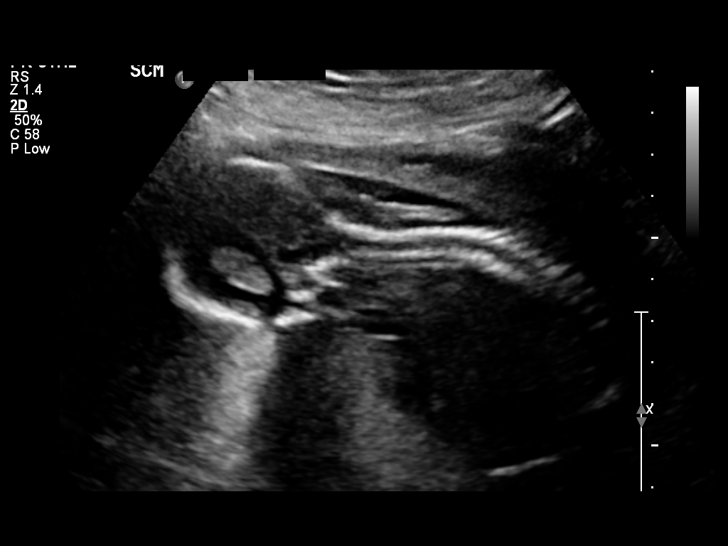
[im 67/96]
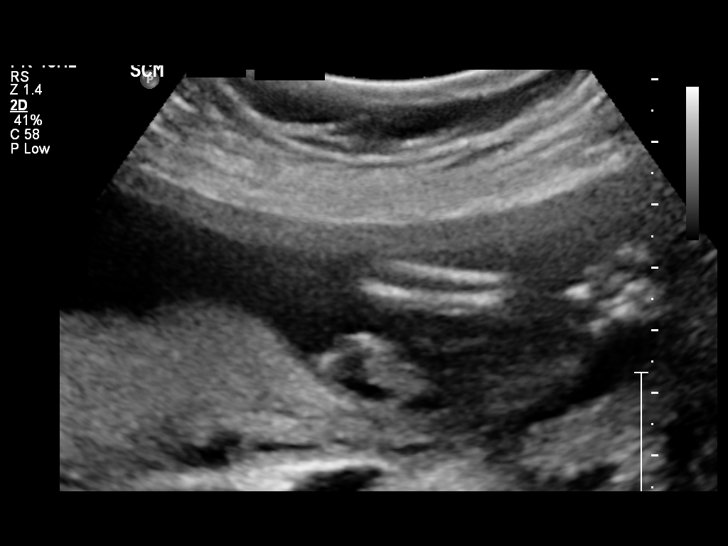
[im 78/96]
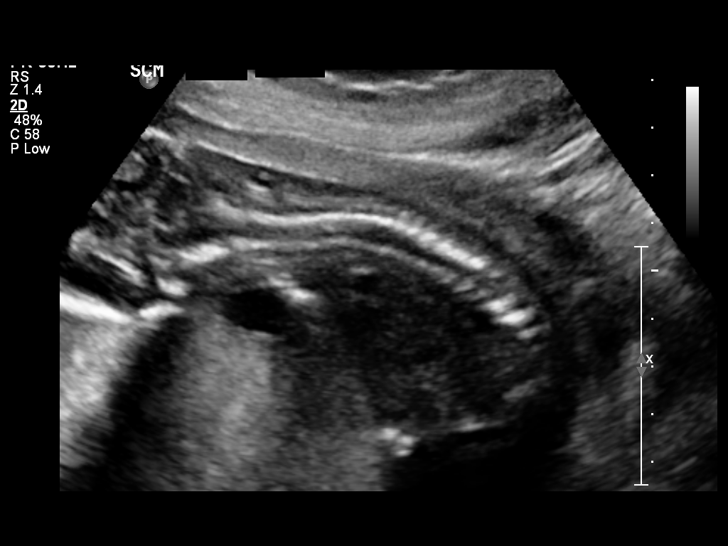
[im 85/96]
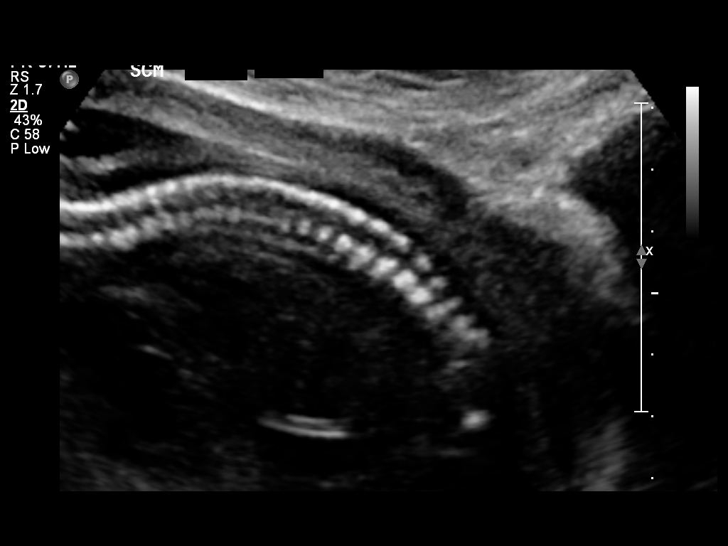
[im 92/96]
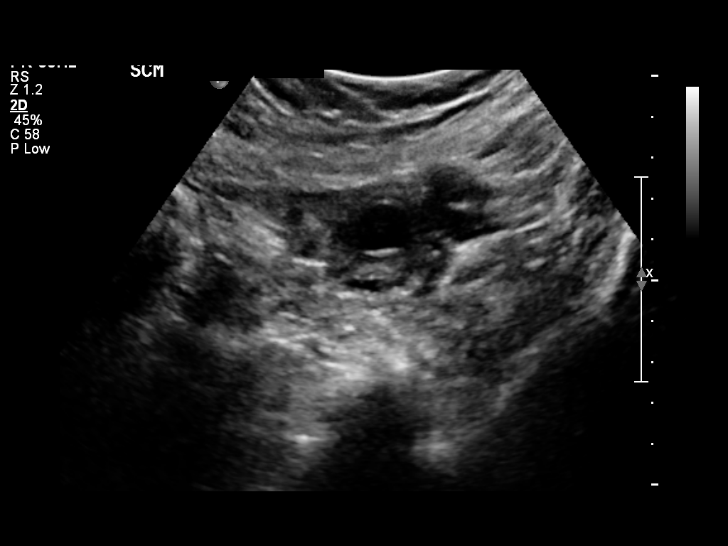

[12 of 28 positions shown; findings below may reference images not displayed]

OBSTETRICS REPORT
                      (Signed Final 05/11/2012 [DATE])

Service(s) Provided

 US OB DETAIL + 14 WK                                  76811.0
Indications

 Detailed fetal anatomic survey
Fetal Evaluation

 Num Of Fetuses:    1
 Fetal Heart Rate:  153                         bpm
 Cardiac Activity:  Observed
 Presentation:      Frank breech
 Placenta:          Posterior, above cervical
                    os

 Amniotic Fluid
 AFI FV:      Subjectively within normal limits
                                             Larg Pckt:     2.7  cm
Biometry

 BPD:       38  mm    G. Age:   17w 4d                CI:        67.15   70 - 86
                                                      FL/HC:      17.9   16.1 -

 HC:     148.5  mm    G. Age:   18w 0d       12  %    HC/AC:      1.10   1.09 -

 AC:     135.5  mm    G. Age:   19w 0d       56  %    FL/BPD:
 FL:      26.6  mm    G. Age:   18w 1d       23  %    FL/AC:      19.6   20 - 24
 HUM:     25.8  mm    G. Age:   18w 0d       35  %
 NFT:     2.54  mm

 Est. FW:     242  gm      0 lb 9 oz     41  %
Gestational Age

 LMP:           18w 5d       Date:   01/01/12                 EDD:   10/07/12
 U/S Today:     18w 1d                                        EDD:   10/11/12
 Best:          18w 5d    Det. By:   LMP  (01/01/12)          EDD:   10/07/12
2nd Trimester Genetic Sonogram - Trisomy 21 Screening

 Age:                                             37          Risk=1:   237

 Structural anomalies (inc. cardiac):             No
 Echogenic bowel:                                 No
 Hypoplastic / absent midphalanx 5th Digit:       No
 Pyelectasis:                                     No
 2-vessel umbilical cord:                         No
 Echogenic cardiac foci:                          No
Anatomy

 Cranium:          Appears normal         Aortic Arch:      Appears normal
 Fetal Cavum:      Appears normal         Ductal Arch:      Appears normal
 Ventricles:       Appears normal         Diaphragm:        Appears normal
 Choroid Plexus:   Appears normal         Stomach:          Appears normal
 Cerebellum:       Appears normal         Abdomen:          Appears normal
 Posterior Fossa:  Appears normal         Abdominal Wall:   Appears nml (cord
                                                            insert, abd wall)
 Nuchal Fold:      Appears normal         Cord Vessels:     Appears normal (3
                                                            vessel cord)
 Face:             Appears normal         Kidneys:          Appear normal
                   (orbits and profile)
 Lips:             Appears normal         Bladder:          Appears normal
 Heart:            Appears normal         Spine:            Appears normal
                   (4CH, axis, and
                   situs)
 RVOT:             Not well visualized    Lower             Appears normal
                                          Extremities:
 LVOT:             Appears normal         Upper             Appears normal
                                          Extremities:

 Other:  Male gender. Heels and 5th digit visualized. Nasal bone visualized.
Cervix Uterus Adnexa

 Cervical Length:   4.1       cm

 Cervix:       Normal appearance by transabdominal scan.
 Left Ovary:   Within normal limits.
 Right Ovary:  Within normal limits.

 Adnexa:     No abnormality visualized.
Impression

 Single living IUP.  US EGA is concordant with LMP.
 No fetal anomalies seen involving visualized anatomy.
 No sonographic markers for aneuploidy visualized.

 questions or concerns.
